# Patient Record
Sex: Male | Born: 1990 | Race: White | Hispanic: No | Marital: Single | Smoking: Never smoker
Health system: Southern US, Community
[De-identification: ages and names within clinical notes are randomized; demographics above are authoritative.]

## PROBLEM LIST (undated history)

## (undated) DIAGNOSIS — F32A Depression, unspecified: Secondary | ICD-10-CM

## (undated) DIAGNOSIS — F329 Major depressive disorder, single episode, unspecified: Secondary | ICD-10-CM

## (undated) DIAGNOSIS — F419 Anxiety disorder, unspecified: Secondary | ICD-10-CM

## (undated) DIAGNOSIS — R002 Palpitations: Secondary | ICD-10-CM

## (undated) HISTORY — DX: Depression, unspecified: F32.A

## (undated) HISTORY — DX: Anxiety disorder, unspecified: F41.9

## (undated) HISTORY — PX: ELBOW FRACTURE SURGERY: SHX616

## (undated) HISTORY — DX: Palpitations: R00.2

## (undated) HISTORY — DX: Major depressive disorder, single episode, unspecified: F32.9

---
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
## 2010-08-13 NOTE — H&P (Signed)
Anthony Bray, PANG NO.:  [id number]   MEDICAL RECORD NO.:  [id number]          PATIENT TYPE:  INP   LOCATION:  0203                          FACILITY:  [facility]   PHYSICIAN:  Lalla Brothers, MDDATE OF BIRTH:  08-30-1990   DATE OF ADMISSION:  06/29/2007  DATE OF DISCHARGE:                       PSYCHIATRIC ADMISSION ASSESSMENT   MENTAL STATUS EXAM:  The patient has narcissistic and obsessive  interpersonal style and defenses.  He does not present significant  anxiety.  He does have significant dysphoria with atypical depressive  features.  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  He is highly  defended.  He overreacts with anger and attributions of unfair purpose  that raise concern for possible early delusions.  However, he is  intellectually capable of working in therapy, even though he currently  refuses.  He has suicidal ideation and homicidal ideation but tends to  disengage from these in order to disengage from inpatient treatment.  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  However, father has  experienced and understands the needs the patient must make to resolve  the relationship that has ended with the ex-girlfriend and to keep the  patient safe as well as prevent any harm by the patient to others.  The  patient has no manic features and no dissociative features.   IMPRESSION:  AXIS I:  1. Major depression, single episode, severe with atypical features.  2. 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  3. Rule out delusional disorder (provisional diagnosis).  4. Other interpersonal problem.  5. Other specified family circumstances.  AXIS II:  Rule rule out mixed personality disorder (provisional  diagnosis).  AXIS III:  1. Status post right elbow fracture with plate and screw fixation      November 2008.  2. Allergy to penicillin.  3. Chewing tobacco.  AXIS IV:  Stressors:  Peer relations - extreme, acute and chronic; phase  of life - extreme, acute and chronic.  AXIS V:  GAF on admission is 28 with highest in last year 68.   PLAN:  The patient is admitted for inpatient adolescent psychiatric and  multidisciplinary and multimodal behavioral treatment in a team-based  programmatic locked psychiatric unit.  The patient and family agree to  Lexapro pharmacotherapy as father did well on this in the past.  They  are educated on the indications, side effects, FDA guidelines, and  warnings and proper use as well as monitoring.  They do discuss a  consideration of Zyprexa or Geodon if needed for aggression and any  misperceptions.  Cognitive behavioral therapy, anger management,  desensitization, grief and loss, interpersonal therapy, habit reversal,  identity consolidation, individuation and separation from girlfriend,  social and communication skill training, and problem-solving and coping  skill training  can be undertaken.  Estimated length stay is 7 days with target symptom  for discharge being stabilization of suicide risk and mood,  stabilization of dangerous obsessive and narcissistic behavior, and  generalization of the capacity for safe effective clear-minded  participation in outpatient treatment.      Lalla Brothers, MD  Electronically Signed     GEJ/MEDQ  D:  06/30/2007  T:  07/01/2007  Job:  [id number]
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
## 2010-12-23 LAB — BASIC METABOLIC PANEL
BUN: 15
Chloride: 102
Creatinine, Ser: 1.12

## 2010-12-23 LAB — GC/CHLAMYDIA PROBE AMP, URINE
Chlamydia, Swab/Urine, PCR: NEGATIVE
GC Probe Amp, Urine: NEGATIVE

## 2010-12-23 LAB — HEPATIC FUNCTION PANEL
Alkaline Phosphatase: 49 — ABNORMAL LOW
Bilirubin, Direct: 0.1
Indirect Bilirubin: 1 — ABNORMAL HIGH
Total Bilirubin: 1.1
Total Protein: 7.3

## 2010-12-23 LAB — DRUGS OF ABUSE SCREEN W/O ALC, ROUTINE URINE
Creatinine,U: 285.2
Marijuana Metabolite: NEGATIVE
Opiate Screen, Urine: NEGATIVE
Propoxyphene: NEGATIVE

## 2010-12-23 LAB — CBC
MCV: 88.3
Platelets: 219
WBC: 8.7

## 2010-12-23 LAB — URINALYSIS, ROUTINE W REFLEX MICROSCOPIC
Nitrite: NEGATIVE
Specific Gravity, Urine: 1.035 — ABNORMAL HIGH
Urobilinogen, UA: 0.2

## 2010-12-23 LAB — DIFFERENTIAL
Basophils Absolute: 0
Eosinophils Absolute: 0.1
Lymphs Abs: 3.3
Neutrophils Relative %: 51

## 2010-12-23 LAB — GAMMA GT: GGT: 13

## 2010-12-23 LAB — T4, FREE: Free T4: 1.59

## 2013-07-18 ENCOUNTER — Emergency Department (HOSPITAL_BASED_OUTPATIENT_CLINIC_OR_DEPARTMENT_OTHER)
Admission: EM | Admit: 2013-07-18 | Discharge: 2013-07-18 | Disposition: A | Discharge status: Home / Self Care | Payer: BC Managed Care – PPO | Attending: Emergency Medicine | Admitting: Emergency Medicine

## 2013-07-18 ENCOUNTER — Encounter (HOSPITAL_BASED_OUTPATIENT_CLINIC_OR_DEPARTMENT_OTHER): Payer: Self-pay | Admitting: Emergency Medicine

## 2013-07-18 ENCOUNTER — Emergency Department (HOSPITAL_BASED_OUTPATIENT_CLINIC_OR_DEPARTMENT_OTHER): Payer: BC Managed Care – PPO

## 2013-07-18 DIAGNOSIS — Z880 Allergy status to penicillin: Secondary | ICD-10-CM | POA: Insufficient documentation

## 2013-07-18 DIAGNOSIS — R002 Palpitations: Secondary | ICD-10-CM

## 2013-07-18 DIAGNOSIS — J329 Chronic sinusitis, unspecified: Secondary | ICD-10-CM

## 2013-07-18 MED ORDER — OXYMETAZOLINE HCL 0.05 % NA SOLN
1.0000 | Freq: Once | NASAL | Status: AC
  Administered 2013-07-18: 1 via NASAL
  Filled 2013-07-18: qty 15

## 2013-07-18 MED ORDER — FLUTICASONE PROPIONATE 50 MCG/ACT NA SUSP
2.0000 | Freq: Every day | NASAL | Status: DC
  Filled 2013-07-18: qty 16

## 2013-07-18 NOTE — ED Notes (Signed)
Pt reports palpitation x 6 months.  States that it happens daily.  Denies SOB/N/V.  Reports worsening at night after eating. States that he also has an URI since Friday.

## 2013-07-18 NOTE — ED Notes (Signed)
Pt c/o intermittent palpations intermittently x6 months. He has had several ekg's that were normal. He sts the palpitations have been worse during the last 3 days after he developed sinus/chest congestion

## 2013-07-18 NOTE — ED Provider Notes (Signed)
CSN: [id number]     Arrival date & time 07/18/13  1215 History   First MD Initiated Contact with Patient 07/18/13 1304     Chief Complaint  Patient presents with  . Palpitations  . Nasal Congestion     (Consider location/radiation/quality/duration/timing/severity/associated sxs/prior Treatment) HPI Comments: Patient is a 23 year old male presented to the emergency department for 2 complaints. He states he has had 5 days of nasal and chest congestion with mild improvement over time. He has not tried any medication for his congestion. Patient states he has also had intermittent episodes of palpitations for the last 6 months, he states the sensation of a missed beat. He denies following up with cardiology issue. Denies any associated chest pain, syncope or shortness of breath with the palpitations. He states makes him feel very anxious. Denies any alleviating factors. Denies any aggravating factors. He is not currently having any palpations here in the ED or today. He denies any increase or change in caffeinated beverages, energy drinks, life stressors, the medications. No familial history of early onset cardiac disease or arrhythmias. He denies any fevers, chills, nausea, vomiting, diarrhea.   History reviewed. No pertinent past medical history. Past Surgical History  Procedure Laterality Date  . Elbow fracture surgery     No family history on file. History  Substance Use Topics  . Smoking status: Never Smoker   . Smokeless tobacco: Not on file  . Alcohol Use: No    Review of Systems  Constitutional: Negative for fever and chills.  HENT: Positive for congestion and sinus pressure. Negative for rhinorrhea.   Respiratory: Negative for cough and shortness of breath.   Cardiovascular: Positive for palpitations.      Allergies  Penicillins  Home Medications   Prior to Admission medications   Not on File   BP 108/60  Pulse 80  Temp(Src) 98.5 F (36.9 C) (Oral)  Resp 18  Wt  165 lb (74.844 kg)  SpO2 100% Physical Exam  Nursing note and vitals reviewed. Constitutional: He is oriented to person, place, and time. He appears well-developed and well-nourished. No distress.  HENT:  Head: Normocephalic and atraumatic.  Right Ear: External ear normal.  Left Ear: External ear normal.  Nose: Nose normal.  Mouth/Throat: Uvula is midline, oropharynx is clear and moist and mucous membranes are normal. No oropharyngeal exudate.  Eyes: Conjunctivae are normal.  Neck: Neck supple.  Cardiovascular: Normal rate, regular rhythm, normal heart sounds and intact distal pulses.   Pulmonary/Chest: Effort normal and breath sounds normal. No respiratory distress. He exhibits no tenderness.  Abdominal: Soft. Bowel sounds are normal. There is no tenderness.  Musculoskeletal: Normal range of motion.  Lymphadenopathy:    He has no cervical adenopathy.  Neurological: He is alert and oriented to person, place, and time.  Skin: Skin is warm and dry. He is not diaphoretic.    ED Course  Procedures (including critical care time) Medications  oxymetazoline (AFRIN) 0.05 % nasal spray 1 spray (1 spray Each Nare Given 07/18/13 1413)    Labs Review Labs Reviewed - No data to display  Imaging Review Dg Chest 2 View  07/18/2013   CLINICAL DATA:  Chest pain, palpitations.  EXAM: CHEST  2 VIEW  COMPARISON:  Chest CT 06/19/2012  FINDINGS: The heart size and mediastinal contours are within normal limits. Both lungs are clear. The visualized skeletal structures are unremarkable.  IMPRESSION: No active cardiopulmonary disease.   Electronically Signed   By: Charlett NoseKevin  Dover M.D.  On: 07/18/2013 13:52     EKG Interpretation   Date/Time:  Monday July 18 2013 12:57:56 EDT Ventricular Rate:  71 PR Interval:  128 QRS Duration: 90 QT Interval:  346 QTC Calculation: 375 R Axis:   79 Text Interpretation:  Normal sinus rhythm Nonspecific ST and T wave  abnormality Abnormal ECG No old tracing to  compare Confirmed by [facility]   MD, CHARLES [phone]([id number]) on 07/18/2013 12:59:33 PM      MDM   Final diagnoses:  Palpitations  Sinusitis    Filed Vitals:   07/18/13 1413  BP: 108/60  Pulse: 80  Temp:   Resp: 18   1) Palpitations: Patient with 6 months of intermittent episodes of palpitations. No associated chest pain, shortness of breath or syncope. No familial history of arrhythmias or sudden cardiac. Lungs clear. Regular rate and rhythm without murmur rub or gallop. EKG unremarkable. Will refer patient to cardiology for possible Holter monitoring and further evaluation of palpitations.  2) Sinusitis: Patient complaining of symptoms of sinusitis.  Mild to moderate symptoms of clear/yellow nasal discharge/congestion and scratchy throat with cough for less than 10 days. CXR negative.  Patient is afebrile.  No concern for acute bacterial rhinosinusitis; likely viral in nature.  Patient discharged with symptomatic treatment.  Patient instructions given for warm saline nasal washes.  Recommendations for follow-up with primary care physician.    Return precautions discussed. Patient agreeable to. Patient stable at time of discharge.  Jeannetta EllisJennifer L Maci Eickholt, PA-C 07/18/13 1431

## 2013-07-18 NOTE — Discharge Instructions (Signed)
Please follow up with your primary care physician in 1-2 days. If you do not have one please call [facility] listed above. Please follow up with the cardiology office to schedule a follow up appointment.  Please use over the counter decongestants to help with sinus symptoms. Please use nasal spray as advised. Please read all discharge instructions and return precautions.   Palpitations  A palpitation is the feeling that your heartbeat is irregular or is faster than normal. It may feel like your heart is fluttering or skipping a beat. Palpitations are usually not a serious problem. However, in some cases, you may need further medical evaluation. CAUSES  Palpitations can be caused by:  Smoking.  Caffeine or other stimulants, such as diet pills or energy drinks.  Alcohol.  Stress and anxiety.  Strenuous physical activity.  Fatigue.  Certain medicines.  Heart disease, especially if you have a history of arrhythmias. This includes atrial fibrillation, atrial flutter, or supraventricular tachycardia.  An improperly working pacemaker or defibrillator. DIAGNOSIS  To find the cause of your palpitations, your caregiver will take your history and perform a physical exam. Tests may also be done, including:  Electrocardiography (ECG). This test records the heart's electrical activity.  Cardiac monitoring. This allows your caregiver to monitor your heart rate and rhythm in real time.  Holter monitor. This is a portable device that records your heartbeat and can help diagnose heart arrhythmias. It allows your caregiver to track your heart activity for several days, if needed.  Stress tests by exercise or by giving medicine that makes the heart beat faster. TREATMENT  Treatment of palpitations depends on the cause of your symptoms and can vary greatly. Most cases of palpitations do not require any treatment other than time, relaxation, and monitoring your symptoms.  Other causes, such as atrial fibrillation, atrial flutter, or supraventricular tachycardia, usually require further treatment. HOME CARE INSTRUCTIONS   Avoid:  Caffeinated coffee, tea, soft drinks, diet pills, and energy drinks.  Chocolate.  Alcohol.  Stop smoking if you smoke.  Reduce your stress and anxiety. Things that can help you relax include:  A method that measures bodily functions so you can learn to control them (biofeedback).  Yoga.  Meditation.  Physical activity such as swimming, jogging, or walking.  Get plenty of rest and sleep. SEEK MEDICAL CARE IF:   You continue to have a fast or irregular heartbeat beyond 24 hours.  Your palpitations occur more often. SEEK IMMEDIATE MEDICAL CARE IF:  You develop chest pain or shortness of breath.  You have a severe headache.  You feel dizzy, or you faint. MAKE SURE YOU:  Understand these instructions.  Will watch your condition.  Will get help right away if you are not doing well or get worse. Document Released: 03/14/2000 Document Revised: 07/12/2012 Document Reviewed: 05/16/2011 [facility] Patient Information 2014 [place], [place]. Sinusitis Sinusitis is redness, soreness, and swelling (inflammation) of the paranasal sinuses. Paranasal sinuses are air pockets within the bones of your face (beneath the eyes, the middle of the forehead, or above the eyes). In healthy paranasal sinuses, mucus is able to drain out, and air is able to circulate through them by way of your nose. However, when your paranasal sinuses are inflamed, mucus and air can become trapped. This can allow bacteria and other germs to grow and cause infection. Sinusitis can develop quickly and last only a short time (acute) or continue over a long period (chronic). Sinusitis that lasts for  more than 12 weeks is considered chronic.  CAUSES  Causes of sinusitis include:  Allergies.  Structural abnormalities, such as displacement of the cartilage that  separates your nostrils (deviated septum), which can decrease the air flow through your nose and sinuses and affect sinus drainage.  Functional abnormalities, such as when the small hairs (cilia) that line your sinuses and help remove mucus do not work properly or are not present. SYMPTOMS  Symptoms of acute and chronic sinusitis are the same. The primary symptoms are pain and pressure around the affected sinuses. Other symptoms include:  Upper toothache.  Earache.  Headache.  Bad breath.  Decreased sense of smell and taste.  A cough, which worsens when you are lying flat.  Fatigue.  Fever.  Thick drainage from your nose, which often is green and may contain pus (purulent).  Swelling and warmth over the affected sinuses. DIAGNOSIS  Your caregiver will perform a physical exam. During the exam, your caregiver may:  Look in your nose for signs of abnormal growths in your nostrils (nasal polyps).  Tap over the affected sinus to check for signs of infection.  View the inside of your sinuses (endoscopy) with a special imaging device with a light attached (endoscope), which is inserted into your sinuses. If your caregiver suspects that you have chronic sinusitis, one or more of the following tests may be recommended:  Allergy tests.  Nasal culture A sample of mucus is taken from your nose and sent to a lab and screened for bacteria.  Nasal cytology A sample of mucus is taken from your nose and examined by your caregiver to determine if your sinusitis is related to an allergy. TREATMENT  Most cases of acute sinusitis are related to a viral infection and will resolve on their own within 10 days. Sometimes medicines are prescribed to help relieve symptoms (pain medicine, decongestants, nasal steroid sprays, or saline sprays).  However, for sinusitis related to a bacterial infection, your caregiver will prescribe antibiotic medicines. These are medicines that will help kill the  bacteria causing the infection.  Rarely, sinusitis is caused by a fungal infection. In theses cases, your caregiver will prescribe antifungal medicine. For some cases of chronic sinusitis, surgery is needed. Generally, these are cases in which sinusitis recurs more than 3 times per year, despite other treatments. HOME CARE INSTRUCTIONS   Drink plenty of water. Water helps thin the mucus so your sinuses can drain more easily.  Use a humidifier.  Inhale steam 3 to 4 times a day (for example, sit in the bathroom with the shower running).  Apply a warm, moist washcloth to your face 3 to 4 times a day, or as directed by your caregiver.  Use saline nasal sprays to help moisten and clean your sinuses.  Take over-the-counter or prescription medicines for pain, discomfort, or fever only as directed by your caregiver. SEEK IMMEDIATE MEDICAL CARE IF:  You have increasing pain or severe headaches.  You have nausea, vomiting, or drowsiness.  You have swelling around your face.  You have vision problems.  You have a stiff neck.  You have difficulty breathing. MAKE SURE YOU:   Understand these instructions.  Will watch your condition.  Will get help right away if you are not doing well or get worse. Document Released: 03/17/2005 Document Revised: 06/09/2011 Document Reviewed: 04/01/2011 [facility] Patient Information 2014 [place], [place].

## 2013-07-19 ENCOUNTER — Encounter: Payer: Self-pay | Admitting: Cardiology

## 2013-07-19 ENCOUNTER — Ambulatory Visit (INDEPENDENT_AMBULATORY_CARE_PROVIDER_SITE_OTHER): Payer: BC Managed Care – PPO | Admitting: Cardiology

## 2013-07-19 ENCOUNTER — Encounter: Payer: Self-pay | Admitting: *Deleted

## 2013-07-19 VITALS — BP 120/76 | HR 84 | Ht 74.0 in | Wt 168.0 lb

## 2013-07-19 DIAGNOSIS — F329 Major depressive disorder, single episode, unspecified: Secondary | ICD-10-CM | POA: Insufficient documentation

## 2013-07-19 DIAGNOSIS — R002 Palpitations: Secondary | ICD-10-CM

## 2013-07-19 DIAGNOSIS — F32A Depression, unspecified: Secondary | ICD-10-CM | POA: Insufficient documentation

## 2013-07-19 DIAGNOSIS — F3289 Other specified depressive episodes: Secondary | ICD-10-CM

## 2013-07-19 LAB — BASIC METABOLIC PANEL
BUN: 14 mg/dL (ref 6–23)
CHLORIDE: 103 meq/L (ref 96–112)
CO2: 28 mEq/L (ref 19–32)
CREATININE: 1 mg/dL (ref 0.4–1.5)
Calcium: 9.8 mg/dL (ref 8.4–10.5)
GFR: 94.53 mL/min (ref >60.00)
GLUCOSE: 84 mg/dL (ref 70–99)
POTASSIUM: 4.1 meq/L (ref 3.5–5.1)
Sodium: 139 mEq/L (ref 135–145)

## 2013-07-19 LAB — TSH: TSH: 1.27 u[IU]/mL (ref 0.35–5.50)

## 2013-07-19 NOTE — Assessment & Plan Note (Signed)
Management per primary care. 

## 2013-07-19 NOTE — Patient Instructions (Signed)
Your physician wants you to follow-up in: 3 MONTHS WITH DR CRENSHAW You will receive a reminder letter in the mail two months in advance. If you don't receive a letter, please call our office to schedule the follow-up appointment.   Your physician recommends that you HAVE LAB WORK TODAY 

## 2013-07-19 NOTE — Progress Notes (Signed)
     HPI: 23 yo male for evaluation of palpitations. Seen in ER 4/20 with palpitations. Chest xray negative. Patient states he has had intermittent palpitations since January. These are described a pause followed by a hard heartbeat. He has not had dyspnea on exertion, orthopnea, PND, pedal edema, recent syncope or chest pain.  No current outpatient prescriptions on file.   No current facility-administered medications for this visit.    Allergies  Allergen Reactions  . Penicillins Hives    Past Medical History  Diagnosis Date  . Palpitation   . Depression   . Anxiety     Past Surgical History  Procedure Laterality Date  . Elbow fracture surgery      History   Social History  . Marital Status: Single    Spouse Name: N/A    Number of Children: N/A  . Years of Education: N/A   Occupational History  .      Farm   Social History Main Topics  . Smoking status: Never Smoker   . Smokeless tobacco: Not on file  . Alcohol Use: No  . Drug Use: No  . Sexual Activity: Not on file   Other Topics Concern  . Not on file   Social History Narrative  . No narrative on file    Family History  Problem Relation Age of Onset  . Hypertension Father     ROS: no fevers or chills, productive cough, hemoptysis, dysphasia, odynophagia, melena, hematochezia, dysuria, hematuria, rash, seizure activity, orthopnea, PND, pedal edema, claudication. Remaining systems are negative.  Physical Exam:   Blood pressure 120/76, pulse 84, height 6\' 2"  (1.88 m), weight 168 lb (76.204 kg).  General:  Well developed/well nourished in NAD Skin warm/dry Patient not depressed No peripheral clubbing Back-normal HEENT-normal/normal eyelids Neck supple/normal carotid upstroke bilaterally; no bruits; no JVD; no thyromegaly chest - CTA/ normal expansion CV - RRR/normal S1 and S2; no murmurs, rubs or gallops;  PMI nondisplaced Abdomen -NT/ND, no HSM, no mass, + bowel sounds, no bruit 2+ femoral  pulses, no bruits Ext-no edema, chords, 2+ DP Neuro-grossly nonfocal  ECG 07/18/13-sinus with no ST changes

## 2013-07-19 NOTE — Assessment & Plan Note (Signed)
Patient sounds to be having PACs or PVCs. [facility] [place] [facility] [place]. We will ask for those results. Check TSH and potassium. I have asked him to decrease his caffeine intake and chewing tobacco. If his symptoms worsen we will consider the addition of a beta blocker. We could also consider a monitor.

## 2013-07-20 NOTE — ED Provider Notes (Signed)
Medical screening examination/treatment/procedure(s) were performed by non-physician practitioner and as supervising physician I was immediately available for consultation/collaboration.   EKG Interpretation   Date/Time:  Monday July 18 2013 12:57:56 EDT Ventricular Rate:  71 PR Interval:  128 QRS Duration: 90 QT Interval:  346 QTC Calculation: 375 R Axis:   79 Text Interpretation:  Normal sinus rhythm Nonspecific ST and T wave  abnormality Abnormal ECG No old tracing to compare Confirmed by [facility]   MD, CHARLES [phone]([id number]) on 07/18/2013 12:59:33 PM        Leonette Mostharles B. Bernette MayersSheldon, MD 07/20/13 1354

## 2013-07-22 ENCOUNTER — Telehealth: Payer: Self-pay | Admitting: Cardiology

## 2013-07-22 NOTE — Telephone Encounter (Signed)
ROI faxed to Dr.Daniel Jobe/[facility] @ [phone]

## 2013-07-25 ENCOUNTER — Telehealth: Payer: Self-pay | Admitting: Cardiology

## 2013-07-25 NOTE — Telephone Encounter (Signed)
Records rec From Dr.Jobe gave to Victorio Palmebra M

## 2013-10-19 ENCOUNTER — Encounter: Payer: BC Managed Care – PPO | Admitting: Cardiology

## 2013-10-19 NOTE — Progress Notes (Signed)
      HPI: FU palpitations. Echocardiogram February 2014 showed normal LV function, trace mitral and tricuspid regurgitation. Seen in ER 4/15 with palpitations. Chest xray negative. Potassium and TSH April 2015 normal. Since he was last seen,    No current outpatient prescriptions on file.   No current facility-administered medications for this visit.     Past Medical History  Diagnosis Date  . Palpitation   . Depression   . Anxiety     Past Surgical History  Procedure Laterality Date  . Elbow fracture surgery      History   Social History  . Marital Status: Single    Spouse Name: N/A    Number of Children: N/A  . Years of Education: N/A   Occupational History  .      Farm   Social History Main Topics  . Smoking status: Never Smoker   . Smokeless tobacco: Not on file  . Alcohol Use: No  . Drug Use: No  . Sexual Activity: Not on file   Other Topics Concern  . Not on file   Social History Narrative  . No narrative on file    ROS: no fevers or chills, productive cough, hemoptysis, dysphasia, odynophagia, melena, hematochezia, dysuria, hematuria, rash, seizure activity, orthopnea, PND, pedal edema, claudication. Remaining systems are negative.  Physical Exam: Well-developed well-nourished in no acute distress.  Skin is warm and dry.  HEENT is normal.  Neck is supple.  Chest is clear to auscultation with normal expansion.  Cardiovascular exam is regular rate and rhythm.  Abdominal exam nontender or distended. No masses palpated. Extremities show no edema. neuro grossly intact  ECG     This encounter was created in error - please disregard.

## 2013-11-09 ENCOUNTER — Encounter: Payer: Self-pay | Admitting: Cardiology

## 2015-01-21 IMAGING — CR DG CHEST 2V
2 series · 2 of 2 positions shown · non-contrast
Comparison: Chest CT 06/19/2012

CLINICAL DATA: Chest pain, palpitations.

EXAM:
CHEST  2 VIEW

[w chest pa]
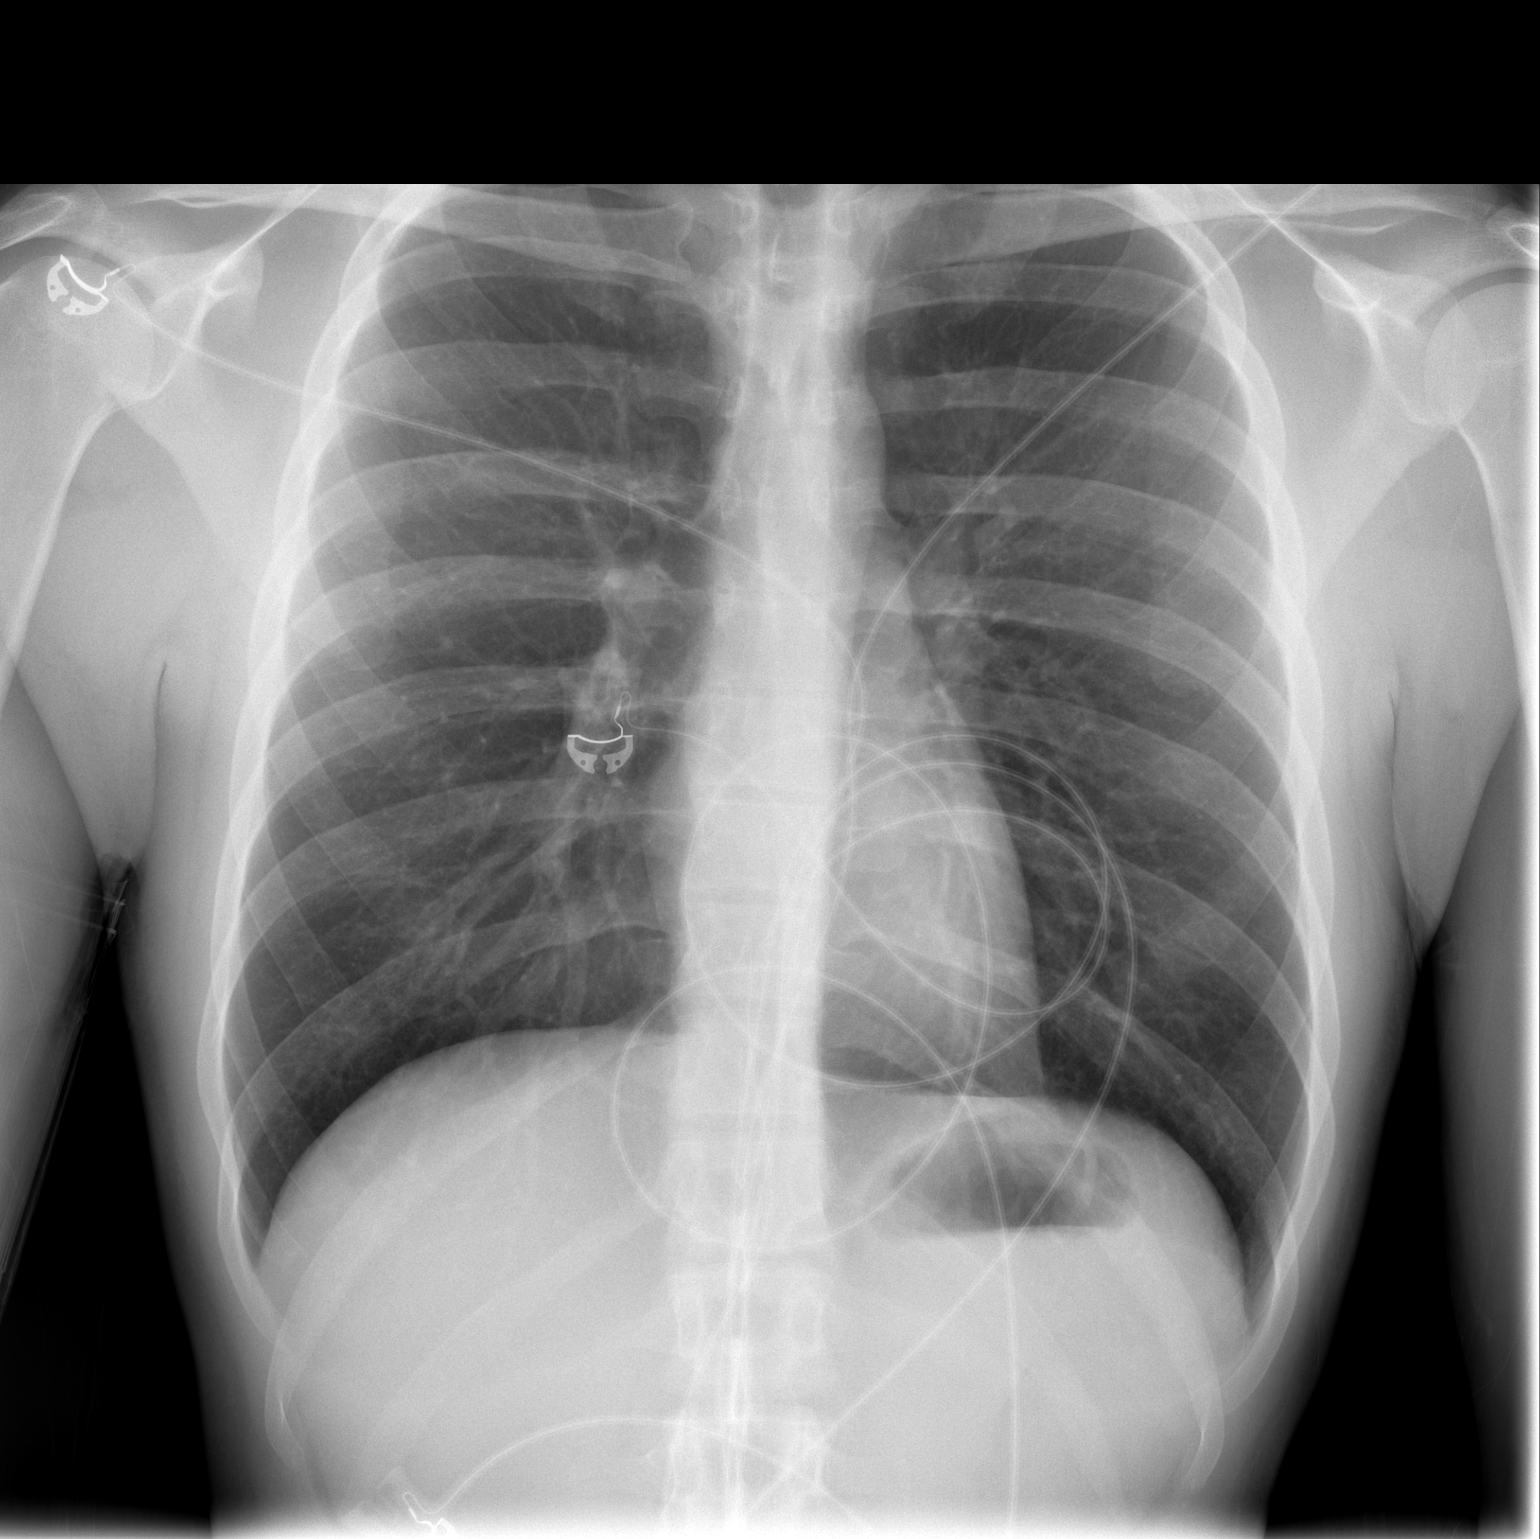

[w chest lat]
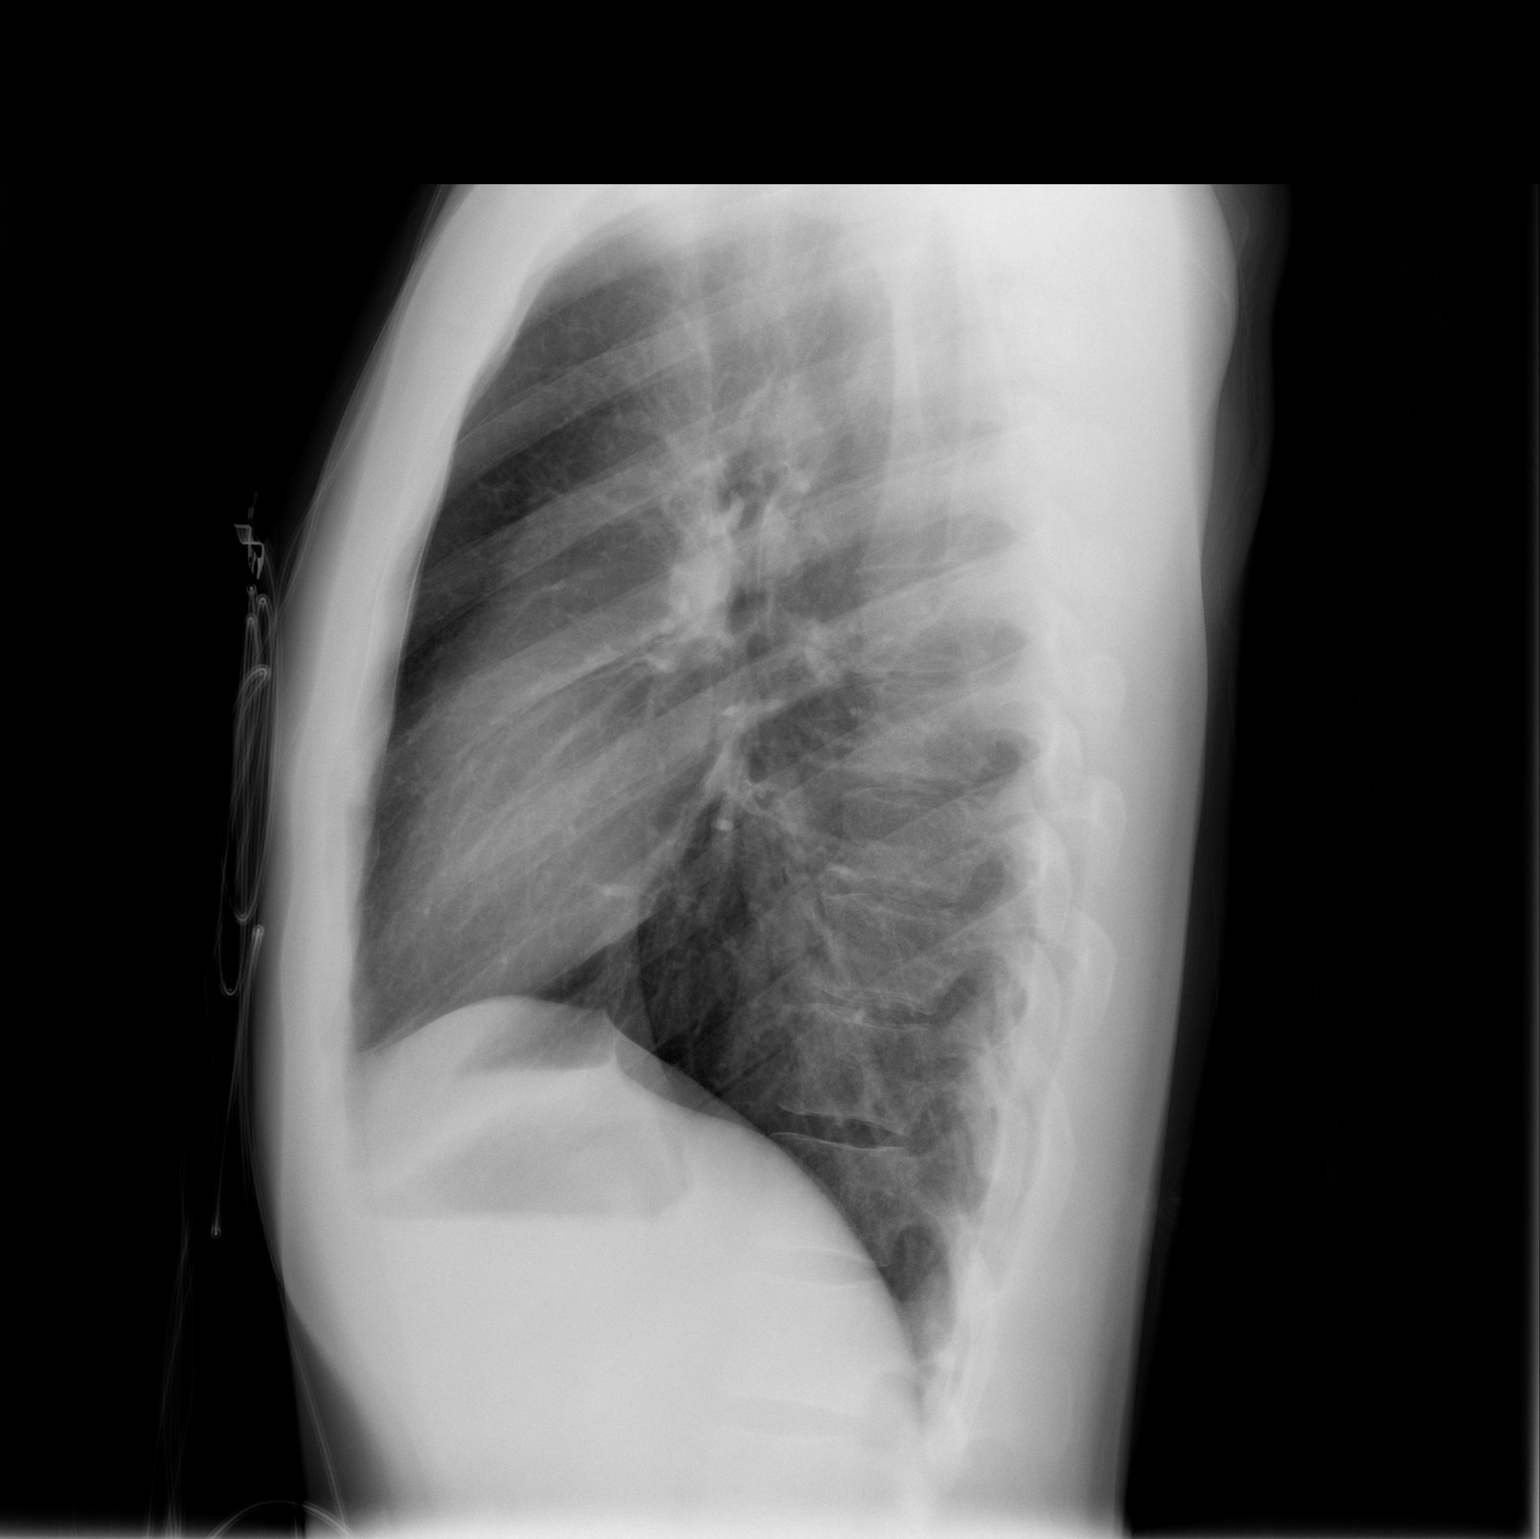

[2 of 2 positions shown; findings below may reference images not displayed]

FINDINGS: The heart size and mediastinal contours are within normal limits.
Both lungs are clear. The visualized skeletal structures are
unremarkable.
IMPRESSION: No active cardiopulmonary disease.
